# Patient Record
Sex: Female | Born: 2000
Health system: Southern US, Community
[De-identification: ages and names within clinical notes are randomized; demographics above are authoritative.]

## PROBLEM LIST (undated history)

## (undated) DIAGNOSIS — T7840XA Allergy, unspecified, initial encounter: Secondary | ICD-10-CM

## (undated) DIAGNOSIS — G43909 Migraine, unspecified, not intractable, without status migrainosus: Secondary | ICD-10-CM

## (undated) DIAGNOSIS — J45909 Unspecified asthma, uncomplicated: Secondary | ICD-10-CM

## (undated) HISTORY — DX: Unspecified asthma, uncomplicated: J45.909

## (undated) HISTORY — DX: Allergy, unspecified, initial encounter: T78.40XA

## (undated) HISTORY — PX: TONSILLECTOMY AND ADENOIDECTOMY: SUR1326

## (undated) HISTORY — PX: TONSILLECTOMY AND ADENOIDECTOMY: SHX28

---
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
## 2019-01-06 DIAGNOSIS — Z7189 Other specified counseling: Secondary | ICD-10-CM | POA: Diagnosis not present

## 2019-01-06 DIAGNOSIS — Z713 Dietary counseling and surveillance: Secondary | ICD-10-CM | POA: Diagnosis not present

## 2019-01-06 DIAGNOSIS — Z0000 Encounter for general adult medical examination without abnormal findings: Secondary | ICD-10-CM | POA: Diagnosis not present

## 2019-01-06 DIAGNOSIS — Z68.41 Body mass index (BMI) pediatric, 5th percentile to less than 85th percentile for age: Secondary | ICD-10-CM | POA: Diagnosis not present

## 2019-01-25 DIAGNOSIS — J988 Other specified respiratory disorders: Secondary | ICD-10-CM | POA: Diagnosis not present

## 2019-01-25 DIAGNOSIS — Z20828 Contact with and (suspected) exposure to other viral communicable diseases: Secondary | ICD-10-CM | POA: Diagnosis not present

## 2019-01-27 DIAGNOSIS — H5203 Hypermetropia, bilateral: Secondary | ICD-10-CM | POA: Diagnosis not present

## 2019-01-27 DIAGNOSIS — H52223 Regular astigmatism, bilateral: Secondary | ICD-10-CM | POA: Diagnosis not present

## 2019-01-28 DIAGNOSIS — Z111 Encounter for screening for respiratory tuberculosis: Secondary | ICD-10-CM | POA: Diagnosis not present

## 2019-11-23 ENCOUNTER — Ambulatory Visit: Payer: Self-pay | Admitting: Obstetrics and Gynecology

## 2019-11-23 ENCOUNTER — Encounter: Payer: Self-pay | Admitting: Obstetrics and Gynecology

## 2019-11-23 ENCOUNTER — Ambulatory Visit: Payer: Managed Care, Other (non HMO) | Admitting: Obstetrics and Gynecology

## 2019-11-23 ENCOUNTER — Other Ambulatory Visit: Payer: Self-pay

## 2019-11-23 VITALS — BP 116/76 | Ht 65.0 in | Wt 133.0 lb

## 2019-11-23 DIAGNOSIS — Z3009 Encounter for other general counseling and advice on contraception: Secondary | ICD-10-CM | POA: Diagnosis not present

## 2019-11-23 DIAGNOSIS — N939 Abnormal uterine and vaginal bleeding, unspecified: Secondary | ICD-10-CM

## 2019-11-23 MED ORDER — NORGESTIMATE-ETH ESTRADIOL 0.25-35 MG-MCG PO TABS: 1.0000 | ORAL_TABLET | Freq: Every day | ORAL | 4 refills | Status: DC

## 2019-11-23 NOTE — Progress Notes (Signed)
   Tanya Scott [birth year] [id number]  SUBJECTIVE:  19 y.o. G0P0000 female presents for irregular bleeding with Nexplanon in place since February 2021, had it placed in [place].  She is more or less having continuous bleeding since 2 weeks after the Nexplanon was placed.   Prior to using birth control she had monthly periods, with normal flow that lasted 3-4 days, no intermenstrual bleeding.  She tolerated oral contraceptive pills well in the past.  Current Outpatient Medications  Medication Sig Dispense Refill  . acetaminophen (TYLENOL) 500 MG tablet Take 2 tablets (1,000 mg total) by mouth every 8 (eight) hours as needed for mild pain. 30 tablet 0  . albuterol (PROAIR HFA) 108 (90 Base) MCG/ACT inhaler Inhale 2 puffs into the lungs every 4 (four) hours as needed for wheezing or shortness of breath.     . cetirizine (ZYRTEC) 10 MG tablet Take 10 mg by mouth daily.    . Multiple Vitamin (MULTIVITAMIN WITH MINERALS) TABS tablet Take 1 tablet by mouth daily. 30 tablet 0   No current facility-administered medications for this visit.   Allergies: Penicillins  Patient's last menstrual period was 09/07/2019.  Past medical history,surgical history, problem list, medications, allergies, family history and social history were all reviewed and documented as reviewed in the EPIC chart.  ROS:  Feeling well. No dyspnea or chest pain on exertion.  No abdominal pain, change in bowel habits, black or bloody stools.  No urinary tract symptoms. GYN ROS: no abnormal bleeding, pelvic pain or discharge, no breast pain or new or enlarging lumps on self exam. No neurological complaints.   OBJECTIVE:  BP 116/76   Ht 5\' 5"  (1.651 m)   Wt 133 lb (60.3 kg)   LMP 09/07/2019   BMI 22.13 kg/m  The patient appears well, alert, oriented x 3, in no distress. PELVIC EXAM: deferred   ASSESSMENT:  19 y.o. G0P0000 here for contraceptive counseling  PLAN:  We discussed that abnormal uterine bleeding is a  common side effect of Nexplanon use.  It sounds like she had a very normal period prior to using Nexplanon.  She requests to have the Nexplanon removed.  We discussed her other options will be to trial combined hormonal contraceptive pills, a short course of oral estrogen, or a short course of Provera to see if this would improve the bleeding profile.  She indicates that she does not want to try anything like this right now and does not want the additional hormone dose, so she would prefer just to have the Nexplanon removed and resume oral contraceptive pills alone.  We also discussed briefly other potential contraceptive options.  She can go ahead and start the OCPs right away and she will schedule a separate appointment for Nexplanon removal at her convenience.   12 MD 11/23/19

## 2019-11-24 ENCOUNTER — Encounter: Payer: Self-pay | Admitting: Obstetrics and Gynecology

## 2019-11-24 ENCOUNTER — Ambulatory Visit (INDEPENDENT_AMBULATORY_CARE_PROVIDER_SITE_OTHER): Payer: Managed Care, Other (non HMO) | Admitting: Obstetrics and Gynecology

## 2019-11-24 VITALS — BP 110/62

## 2019-11-24 DIAGNOSIS — Z3046 Encounter for surveillance of implantable subdermal contraceptive: Secondary | ICD-10-CM | POA: Diagnosis not present

## 2019-11-24 NOTE — Progress Notes (Signed)
   Tanya Scott [birth year] [id number]  SUBJECTIVE:  19 y.o. G0P0000 female presents for Nexplanon removal.  Nexplanon removal procedure note  The Nexplanon device was palpated in the patient's left arm. The site was cleansed with Betadine swab.  1.5 mL of 1% lidocaine was injected just beneath the skin where the skin incision was to be made over the old insertion site scar.   A change to sterile gloves was made.  Adequate anesthesia was confirmed.  A small nick was made in the skin with an 11 blade scalpel.  Applying pressure to the arm presented the end of the Nexplanon device through the skin incision.  The device was grasped and gentle traction on the device resulted in its removal in its entirety as visually confirmed by the patient and assistant.  The site was then dressed in a normal fashion.  The patient tolerated the procedure well.  Kennon Portela was present during the procedure.  PLAN:  She will be starting the oral contraceptive pills today.  Usual post removal precautions are reviewed.  Let us know if any problems.   Theresia Majors MD 11/24/19

## 2020-09-10 DIAGNOSIS — S060X0A Concussion without loss of consciousness, initial encounter: Secondary | ICD-10-CM | POA: Diagnosis not present

## 2020-10-22 DIAGNOSIS — K2900 Acute gastritis without bleeding: Secondary | ICD-10-CM | POA: Diagnosis not present

## 2020-10-22 DIAGNOSIS — B2790 Infectious mononucleosis, unspecified without complication: Secondary | ICD-10-CM | POA: Diagnosis not present

## 2020-10-27 DIAGNOSIS — S060X0D Concussion without loss of consciousness, subsequent encounter: Secondary | ICD-10-CM | POA: Diagnosis not present

## 2021-01-24 ENCOUNTER — Other Ambulatory Visit: Payer: Self-pay

## 2021-10-14 ENCOUNTER — Emergency Department
Admission: EM | Admit: 2021-10-14 | Discharge: 2021-10-14 | Disposition: A | Discharge status: Home / Self Care | Payer: BC Managed Care – PPO | Attending: Emergency Medicine | Admitting: Emergency Medicine

## 2021-10-14 ENCOUNTER — Other Ambulatory Visit: Payer: Self-pay

## 2021-10-14 DIAGNOSIS — G43909 Migraine, unspecified, not intractable, without status migrainosus: Secondary | ICD-10-CM | POA: Insufficient documentation

## 2021-10-14 DIAGNOSIS — R519 Headache, unspecified: Secondary | ICD-10-CM | POA: Diagnosis present

## 2021-10-14 MED ORDER — METOCLOPRAMIDE HCL 5 MG/ML IJ SOLN
10.0000 mg | Freq: Once | INTRAMUSCULAR | Status: AC
  Administered 2021-10-14: 10 mg via INTRAVENOUS
  Filled 2021-10-14: qty 2

## 2021-10-14 MED ORDER — DIPHENHYDRAMINE HCL 50 MG/ML IJ SOLN
25.0000 mg | Freq: Once | INTRAMUSCULAR | Status: AC
  Administered 2021-10-14: 25 mg via INTRAVENOUS
  Filled 2021-10-14: qty 1

## 2021-10-14 MED ORDER — KETOROLAC TROMETHAMINE 30 MG/ML IJ SOLN
30.0000 mg | Freq: Once | INTRAMUSCULAR | Status: AC
  Administered 2021-10-14: 30 mg via INTRAVENOUS
  Filled 2021-10-14: qty 1

## 2021-10-14 MED ORDER — SODIUM CHLORIDE 0.9 % IV BOLUS
1000.0000 mL | Freq: Once | INTRAVENOUS | Status: AC
  Administered 2021-10-14: 1000 mL via INTRAVENOUS

## 2021-10-14 NOTE — ED Triage Notes (Signed)
Pt comes pov for a migraine. Has hx of same and states feels the same. Started last night. Is being treated for pneumonia but is not here for that. Is on oral antibiotics for that.  ?

## 2021-10-14 NOTE — ED Notes (Addendum)
Benadryl, reglan, magnesium fultate  without relief. Last seen Migraine MD last year in [place]. Also pain in neck, base of skull to R side ?

## 2021-10-14 NOTE — ED Provider Notes (Signed)
? ?[facility] ?Provider Note ? ? ? None  ?  (approximate) ? ? ?History  ? ?Migraine ? ? ?HPI ? ?Tanya Scott is a 21 y.o. female   presents to the ED with complaint of migraine headache.  Patient has a history of migraines and states that this is very similar to her normal migraines.  Patient has nausea but presently is not vomiting.  Patient infrequently gets migraines.  Currently she is being treated for pneumonia with oral antibiotics.  Patient rates her pain as a 9/10. ? ?  ? ? ?Physical Exam  ? ?Triage Vital Signs: ?ED Triage Vitals [10/14/21 0851]  ?Enc Vitals Group  ?   BP 120/83  ?   Pulse Rate 60  ?   Resp 18  ?   Temp 97.9 ?F (36.6 ?C)  ?   Temp Source Oral  ?   SpO2 98 %  ?   Weight 130 lb (59 kg)  ?   Height   ?   Head Circumference   ?   Peak Flow   ?   Pain Score 9  ?   Pain Loc   ?   Pain Edu?   ?   Excl. in GC?   ? ? ?Most recent vital signs: ?Vitals:  ? 10/14/21 0851  ?BP: 120/83  ?Pulse: 60  ?Resp: 18  ?Temp: 97.9 ?F (36.6 ?C)  ?SpO2: 98%  ? ? ? ?General: Awake, no distress.  Photosensitivity.  Answers questions appropriately. ?CV:  Good peripheral perfusion.  Heart regular rate and rhythm. ?Resp:  Normal effort.  Lungs are clear bilaterally. ?Abd:  No distention.  ?Other:  PERRLA, EOMI's, normal speech, cranial nerves II through XII grossly intact.  Good muscle strength bilaterally at 5/5. ? ? ?ED Results / Procedures / Treatments  ? ?Labs ?(all labs ordered are listed, but only abnormal results are displayed) ?Labs Reviewed - No data to display ? ? ? ? ?PROCEDURES: ? ?Critical Care performed:  ? ?Procedures ? ? ?MEDICATIONS ORDERED IN ED: ?Medications  ?sodium chloride 0.9 % bolus 1,000 mL (0 mLs Intravenous Stopped 10/14/21 1033)  ?diphenhydrAMINE (BENADRYL) injection 25 mg (25 mg Intravenous Given 10/14/21 0935)  ?metoCLOPramide (REGLAN) injection 10 mg (10 mg Intravenous Given 10/14/21 0937)  ?ketorolac (TORADOL) 30 MG/ML injection 30 mg (30 mg Intravenous Given  10/14/21 0938)  ? ? ? ?IMPRESSION / MDM / ASSESSMENT AND PLAN / ED COURSE  ?I reviewed the triage vital signs and the nursing notes. ? ? ?Differential diagnosis includes, but is not limited to, migraine headache, muscle contraction headache. ? ?21 year old female presents to the ED with her usual migraine headache that started yesterday and is not been relieved by over-the-counter medication.  There is no change in her current pain in comparison with her other migraine headaches.  Cranial nerves II through XII grossly intact and remainder of the exam was benign with patient being photosensitive.  Patient was given headache cocktail including IV fluids, Toradol 30 mg IV, Benadryl 25 mg IV and Reglan 10 mg IV.  Patient improved greatly and had no continued nausea, vomiting and headache was greatly improved.  She is instructed to follow-up with her PCP if any continued problems. ? ? ? ? ?FINAL CLINICAL IMPRESSION(S) / ED DIAGNOSES  ? ?Final diagnoses:  ?Migraine without status migrainosus, not intractable, unspecified migraine type  ? ? ? ?Rx / DC Orders  ? ?ED Discharge Orders   ? ? None  ? ?  ? ? ? ?  Note:  This document was prepared using Dragon voice recognition software and may include unintentional dictation errors. ?  ?Tommi Rumps, PA-C ?10/14/21 1302 ? ?  ?Arnaldo Natal, MD ?10/14/21 1600 ? ?

## 2021-10-14 NOTE — Discharge Instructions (Addendum)
Follow-up with your regular doctor if any continued problems.  Drink lots of fluids today. ?
